# Patient Record
Sex: Male | Born: 1975 | Race: White | Hispanic: No | Marital: Married | Smoking: Never smoker
Health system: Southern US, Community
[De-identification: ages and names within clinical notes are randomized; demographics above are authoritative.]

## PROBLEM LIST (undated history)

## (undated) DIAGNOSIS — E785 Hyperlipidemia, unspecified: Secondary | ICD-10-CM

## (undated) HISTORY — PX: HERNIA REPAIR: SHX51

## (undated) HISTORY — DX: Hyperlipidemia, unspecified: E78.5

---

## 2004-05-06 ENCOUNTER — Emergency Department (HOSPITAL_COMMUNITY): Admission: EM | Admit: 2004-05-06 | Discharge: 2004-05-06 | Payer: Self-pay | Admitting: Emergency Medicine

## 2011-04-16 ENCOUNTER — Ambulatory Visit (INDEPENDENT_AMBULATORY_CARE_PROVIDER_SITE_OTHER): Payer: BC Managed Care – PPO | Admitting: General Surgery

## 2011-04-16 ENCOUNTER — Encounter (INDEPENDENT_AMBULATORY_CARE_PROVIDER_SITE_OTHER): Payer: Self-pay | Admitting: General Surgery

## 2011-04-16 VITALS — BP 132/88 | HR 88 | Temp 98.0°F | Resp 20 | Ht 72.0 in | Wt 212.0 lb

## 2011-04-16 DIAGNOSIS — R1031 Right lower quadrant pain: Secondary | ICD-10-CM

## 2011-04-16 NOTE — Patient Instructions (Signed)
Call me in 6 weeks.  If better with no symptoms, can follow up as needed.  If symptoms persisting or worsening, we will obtain a CT scan of the pelvis and a return visit.

## 2011-04-16 NOTE — Progress Notes (Signed)
Subjective:     Patient ID: Edward Mccullough, male   DOB: [birth year], 35 y.o.   MRN: [id number]  HPI This is a 35 year old male who had a right inguinal hernia repair in 1995 and a left inguinal hernia repair in 1996. Both of these were done with mesh. He has done well until about 2 months ago when he began to notice some right groin discomfort. This is really always when he is sitting down which he does mostly at work. It bothers him mostly every day when he is sitting down. It does not bother him at our way standing or with any activity. He does not notice a bulge or lump in his right groin associated with this at all. It is relieved by standing. He comes in today to see if he has a recurrent right inguinal hernia as the source of this pain.  Review of Systems     Objective:   Physical Exam  Constitutional: He appears well-developed and well-nourished.  Abdominal: Soft. Bowel sounds are normal. He exhibits no mass. There is no tenderness. Hernia confirmed negative in the right inguinal area and confirmed negative in the left inguinal area.  Genitourinary: Testes normal and penis normal. Right testis shows no mass and no tenderness. Left testis shows no mass and no tenderness.  Lymphadenopathy:       Right: No inguinal adenopathy present.       Left: No inguinal adenopathy present.   Well healed bilateral groin incisions    Assessment:     Right groin pain    Plan:     I do not think he has a hernia on his exam today. His history is not really consistent completely with a recurrent hernia. I think most likely he's got strain of his right groin. I would give him 6 weeks of trying to rest this and using anti-inflammatories as needed. If this discomfort persists then we'll proceed with a CT scan of his pelvis in 6 weeks.

## 2011-08-03 ENCOUNTER — Encounter (HOSPITAL_COMMUNITY): Payer: Self-pay | Admitting: *Deleted

## 2011-08-03 ENCOUNTER — Emergency Department (HOSPITAL_COMMUNITY)
Admission: EM | Admit: 2011-08-03 | Discharge: 2011-08-04 | Disposition: A | Discharge status: Home / Self Care | Payer: BC Managed Care – PPO | Attending: Emergency Medicine | Admitting: Emergency Medicine

## 2011-08-03 ENCOUNTER — Emergency Department (HOSPITAL_COMMUNITY): Payer: BC Managed Care – PPO

## 2011-08-03 DIAGNOSIS — R0682 Tachypnea, not elsewhere classified: Secondary | ICD-10-CM | POA: Insufficient documentation

## 2011-08-03 DIAGNOSIS — N201 Calculus of ureter: Secondary | ICD-10-CM | POA: Insufficient documentation

## 2011-08-03 DIAGNOSIS — R109 Unspecified abdominal pain: Secondary | ICD-10-CM | POA: Insufficient documentation

## 2011-08-03 DIAGNOSIS — N23 Unspecified renal colic: Secondary | ICD-10-CM

## 2011-08-03 LAB — URINALYSIS, ROUTINE W REFLEX MICROSCOPIC
Glucose, UA: NEGATIVE mg/dL
Ketones, ur: NEGATIVE mg/dL
Protein, ur: 30 mg/dL — AB
pH: 7.5 (ref 5.0–8.0)

## 2011-08-03 LAB — CBC
MCV: 81.9 fL (ref 78.0–100.0)
Platelets: 185 10*3/uL (ref 150–400)
RBC: 4.8 MIL/uL (ref 4.22–5.81)
RDW: 12.7 % (ref 11.5–15.5)
WBC: 10.8 10*3/uL — ABNORMAL HIGH (ref 4.0–10.5)

## 2011-08-03 LAB — DIFFERENTIAL
Basophils Absolute: 0.1 10*3/uL (ref 0.0–0.1)
Eosinophils Relative: 1 % (ref 0–5)
Lymphocytes Relative: 17 % (ref 12–46)
Lymphs Abs: 1.8 10*3/uL (ref 0.7–4.0)
Neutro Abs: 8.1 10*3/uL — ABNORMAL HIGH (ref 1.7–7.7)

## 2011-08-03 LAB — URINE MICROSCOPIC-ADD ON

## 2011-08-03 MED ORDER — KETOROLAC TROMETHAMINE 30 MG/ML IJ SOLN
30.0000 mg | Freq: Once | INTRAMUSCULAR | Status: AC
  Administered 2011-08-03: 30 mg via INTRAVENOUS
  Filled 2011-08-03: qty 1

## 2011-08-03 MED ORDER — HYDROMORPHONE HCL PF 1 MG/ML IJ SOLN
1.0000 mg | Freq: Once | INTRAMUSCULAR | Status: AC
  Administered 2011-08-03: 1 mg via INTRAVENOUS
  Filled 2011-08-03: qty 1

## 2011-08-03 MED ORDER — SODIUM CHLORIDE 0.9 % IV BOLUS (SEPSIS)
1000.0000 mL | Freq: Once | INTRAVENOUS | Status: AC
  Administered 2011-08-03: 1000 mL via INTRAVENOUS

## 2011-08-03 MED ORDER — ONDANSETRON HCL 4 MG/2ML IJ SOLN
4.0000 mg | Freq: Once | INTRAMUSCULAR | Status: AC
Start: 2011-08-03 — End: 2011-08-03
  Administered 2011-08-03: 4 mg via INTRAVENOUS
  Filled 2011-08-03: qty 2

## 2011-08-03 NOTE — ED Provider Notes (Signed)
History     CSN: [id number]  Arrival date & time 08/03/11  2209   First MD Initiated Contact with Patient 08/03/11 2312      Chief Complaint  Patient presents with  . Flank Pain    (Consider location/radiation/quality/duration/timing/severity/associated sxs/prior treatment) Patient is a 36 y.o. male presenting with flank pain. The history is provided by the patient.  Flank Pain  He had the onset yesterday of severe left flank pain radiating to the left testicle. The pain lasted about an hour and then continued as a mild pain in tell this evening at about 2000 when the pain again became severe. Pain is currently rated at 9/10. There's been associated nausea and vomiting. He has had some urinary hesitancy but no dysuria. He has not had any fever, chills, or sweats. Nothing makes the pain better nothing makes it worse. He is not taken anything for the pain. He denies similar symptoms in the past.  Past Medical History  Diagnosis Date  . Hyperlipidemia     Past Surgical History  Procedure Date  . Hernia repair 95, 96    BIH with mesh    History reviewed. No pertinent family history.  History  Substance Use Topics  . Smoking status: Never Smoker   . Smokeless tobacco: Former Neurosurgeon  . Alcohol Use: Yes      Review of Systems  Genitourinary: Positive for flank pain.  All other systems reviewed and are negative.    Allergies  Review of patient's allergies indicates no known allergies.  Home Medications  No current outpatient prescriptions on file.  BP 169/93  Pulse 79  Temp(Src) 97.8 F (36.6 C) (Oral)  Resp 24  SpO2 100%  Physical Exam  Nursing note and vitals reviewed.  36 year old male who appears to be in pain. Vital signs are significant for mild tachypnea with respiratory rate of 24 and mild to moderate hypertension with blood pressure 169/93. Oxygen saturation is 100% which is normal. Head is normocephalic and atraumatic. PERRLA, EOMI. Oropharynx is clear.  Neck is supple without adenopathy or JVD or tenderness. Back is nontender along the spine but there is moderate to severe left CVA tenderness. Lungs are clear without rales, wheezes, rhonchi. Heart has regular rate and rhythm without murmur. Abdomen is soft, flat, with moderate tenderness in the left lower quadrant without rebound or guarding. Peristalsis is diminished. Extremities have no cyanosis or edema, full range of motion is present. Skin is warm and moist without rash. Neurologic: Mental status is normal, cranial nerves are intact, there no focal motor or sensory deficits.  ED Course  Procedures (including critical care time)  Labs Reviewed  URINALYSIS, ROUTINE W REFLEX MICROSCOPIC - Abnormal; Notable for the following:    APPearance CLOUDY (*)    Hgb urine dipstick LARGE (*)    Protein, ur 30 (*)    All other components within normal limits  URINE MICROSCOPIC-ADD ON   No results found. Results for orders placed during the hospital encounter of 08/03/11  URINALYSIS, ROUTINE W REFLEX MICROSCOPIC      Component Value Range   Color, Urine YELLOW  YELLOW    APPearance CLOUDY (*) CLEAR    Specific Gravity, Urine 1.024  1.005 - 1.030    pH 7.5  5.0 - 8.0    Glucose, UA NEGATIVE  NEGATIVE (mg/dL)   Hgb urine dipstick LARGE (*) NEGATIVE    Bilirubin Urine NEGATIVE  NEGATIVE    Ketones, ur NEGATIVE  NEGATIVE (mg/dL)   Protein,  ur 30 (*) NEGATIVE (mg/dL)   Urobilinogen, UA 0.2  0.0 - 1.0 (mg/dL)   Nitrite NEGATIVE  NEGATIVE    Leukocytes, UA NEGATIVE  NEGATIVE   URINE MICROSCOPIC-ADD ON      Component Value Range   Squamous Epithelial / LPF RARE  RARE    RBC / HPF TOO NUMEROUS TO COUNT  <3 (RBC/hpf)   Urine-Other FIELD OBSCURED BY RBC'S    CBC      Component Value Range   WBC 10.8 (*) 4.0 - 10.5 (K/uL)   RBC 4.80  4.22 - 5.81 (MIL/uL)   Hemoglobin 14.3  13.0 - 17.0 (g/dL)   HCT 47.8  29.5 - 62.1 (%)   MCV 81.9  78.0 - 100.0 (fL)   MCH 29.8  26.0 - 34.0 (pg)   MCHC 36.4 (*)  30.0 - 36.0 (g/dL)   RDW 30.8  65.7 - 84.6 (%)   Platelets 185  150 - 400 (K/uL)  DIFFERENTIAL      Component Value Range   Neutrophils Relative 75  43 - 77 (%)   Neutro Abs 8.1 (*) 1.7 - 7.7 (K/uL)   Lymphocytes Relative 17  12 - 46 (%)   Lymphs Abs 1.8  0.7 - 4.0 (K/uL)   Monocytes Relative 6  3 - 12 (%)   Monocytes Absolute 0.6  0.1 - 1.0 (K/uL)   Eosinophils Relative 1  0 - 5 (%)   Eosinophils Absolute 0.1  0.0 - 0.7 (K/uL)   Basophils Relative 1  0 - 1 (%)   Basophils Absolute 0.1  0.0 - 0.1 (K/uL)  BASIC METABOLIC PANEL      Component Value Range   Sodium 139  135 - 145 (mEq/L)   Potassium 3.5  3.5 - 5.1 (mEq/L)   Chloride 102  96 - 112 (mEq/L)   CO2 27  19 - 32 (mEq/L)   Glucose, Bld 100 (*) 70 - 99 (mg/dL)   BUN 15  6 - 23 (mg/dL)   Creatinine, Ser 9.62  0.50 - 1.35 (mg/dL)   Calcium 8.7  8.4 - 95.2 (mg/dL)   GFR calc non Af Amer 89 (*) >90 (mL/min)   GFR calc Af Amer >90  >90 (mL/min)   Ct Abdomen Pelvis Wo Contrast  08/04/2011  *RADIOLOGY REPORT*  Clinical Data: Severe left flank pain, vomiting, chills  CT ABDOMEN AND PELVIS WITHOUT CONTRAST  Technique:  Multidetector CT imaging of the abdomen and pelvis was performed following the standard protocol without intravenous contrast. Sagittal and coronal MPR images reconstructed from axial data set.  Comparison: None  Findings: Minimal dependent atelectasis at lung bases. Left hydronephrosis and proximal left hydroureter secondary to a 5 mm diameter 6 mm length mid left ureteral calculus image 72. Tiny nonobstructing right renal calculi. Distal ureters and bladder decompressed. Within limitations of a nonenhanced exam, no focal abnormalities of the liver, spleen, pancreas, or adrenal glands identified. Normal appendix. Tiny umbilical hernia containing fat. No mass, adenopathy, free fluid, or inflammatory process. Stomach and bowel loops unremarkable for technique. No acute osseous findings.  IMPRESSION: Left hydronephrosis and  proximal hydroureter secondary to a 5 mm diameter mid left ureteral calculus. Tiny nonobstructing right renal calculi.  Original Report Authenticated By: Lollie Marrow, M.D.      1. Ureterolithiasis   2. Renal colic on left side    00 45: Patient has had excellent relief of pain with IV Dilaudid, IV Zofran, and IV Toradol. CT scan confirms a left ureteral calculus.  He will be sent home with prescriptions for Percocet, Naprosyn, Reglan, and Flomax. He is referred to urology for followup.   MDM  Clinically, he appears to have an episode of renal colic. You'll be given IV fluids, IV antiemetics, IV narcotics, and IV NSAIDs. CT of the abdomen and pelvis has been ordered.        Dione Booze, MD 08/04/11 [phone]

## 2011-08-03 NOTE — ED Notes (Signed)
Started IV and bolus of NS.  Pt continues to have pain.  Pain is 9/10 in flank

## 2011-08-03 NOTE — ED Notes (Signed)
Pt notified that urine sample is needed.  Showed to bathroom and urine cup given.  Pt repositioned for comfort.  Family is at the bedside.

## 2011-08-03 NOTE — ED Notes (Signed)
Starting yesterday around 2000, the pt began to have severe left flank pain that lasted for only one hour.  Tonight @ approximately 2000, there pain returned and is located still in his left flank area and rated @ 7/10.  Pt has experienced vomiting and chills.  Pt denies hematuria or hx of same.

## 2011-08-04 LAB — BASIC METABOLIC PANEL
CO2: 27 mEq/L (ref 19–32)
Calcium: 8.7 mg/dL (ref 8.4–10.5)
Chloride: 102 mEq/L (ref 96–112)
Glucose, Bld: 100 mg/dL — ABNORMAL HIGH (ref 70–99)
Potassium: 3.5 mEq/L (ref 3.5–5.1)
Sodium: 139 mEq/L (ref 135–145)

## 2011-08-04 MED ORDER — TAMSULOSIN HCL 0.4 MG PO CAPS: 0.4000 mg | ORAL_CAPSULE | Freq: Every day | ORAL | Status: DC

## 2011-08-04 MED ORDER — NAPROXEN 500 MG PO TABS: 500.0000 mg | ORAL_TABLET | Freq: Two times a day (BID) | ORAL | Status: DC

## 2011-08-04 MED ORDER — METOCLOPRAMIDE HCL 10 MG PO TABS: 10.0000 mg | ORAL_TABLET | Freq: Four times a day (QID) | ORAL | Status: DC | PRN

## 2011-08-04 MED ORDER — METOCLOPRAMIDE HCL 10 MG PO TABS: 10.0000 mg | ORAL_TABLET | Freq: Four times a day (QID) | ORAL | Status: AC | PRN

## 2011-08-04 MED ORDER — OXYCODONE-ACETAMINOPHEN 5-325 MG PO TABS: 1.0000 | ORAL_TABLET | ORAL | Status: AC | PRN

## 2011-08-22 ENCOUNTER — Other Ambulatory Visit: Payer: Self-pay | Admitting: Urology

## 2011-08-28 ENCOUNTER — Encounter (HOSPITAL_COMMUNITY): Payer: Self-pay | Admitting: Pharmacy Technician

## 2011-08-30 ENCOUNTER — Encounter (HOSPITAL_COMMUNITY): Payer: Self-pay | Admitting: *Deleted

## 2011-09-05 ENCOUNTER — Ambulatory Visit (HOSPITAL_COMMUNITY)
Admission: RE | Admit: 2011-09-05 | Discharge: 2011-09-05 | Disposition: A | Discharge status: Home / Self Care | Payer: BC Managed Care – PPO | Source: Ambulatory Visit | Attending: Urology | Admitting: Urology

## 2011-09-05 ENCOUNTER — Encounter (HOSPITAL_COMMUNITY): Payer: Self-pay | Admitting: *Deleted

## 2011-09-05 ENCOUNTER — Encounter (HOSPITAL_COMMUNITY)
Admission: RE | Disposition: A | Discharge status: Home / Self Care | Payer: Self-pay | Source: Ambulatory Visit | Attending: Urology

## 2011-09-05 ENCOUNTER — Ambulatory Visit (HOSPITAL_COMMUNITY): Payer: BC Managed Care – PPO

## 2011-09-05 DIAGNOSIS — E7800 Pure hypercholesterolemia, unspecified: Secondary | ICD-10-CM | POA: Insufficient documentation

## 2011-09-05 DIAGNOSIS — Z8711 Personal history of peptic ulcer disease: Secondary | ICD-10-CM | POA: Insufficient documentation

## 2011-09-05 DIAGNOSIS — N201 Calculus of ureter: Secondary | ICD-10-CM | POA: Insufficient documentation

## 2011-09-05 SURGERY — LITHOTRIPSY, ESWL
Anesthesia: LOCAL | Laterality: Left

## 2011-09-05 MED ORDER — OXYCODONE-ACETAMINOPHEN 5-325 MG PO TABS: 1.0000 | ORAL_TABLET | ORAL | Status: AC | PRN

## 2011-09-05 MED ORDER — DIPHENHYDRAMINE HCL 25 MG PO CAPS
25.0000 mg | ORAL_CAPSULE | ORAL | Status: AC
  Administered 2011-09-05: 25 mg via ORAL

## 2011-09-05 MED ORDER — DIAZEPAM 5 MG PO TABS
10.0000 mg | ORAL_TABLET | ORAL | Status: AC
  Administered 2011-09-05: 10 mg via ORAL

## 2011-09-05 MED ORDER — DEXTROSE-NACL 5-0.45 % IV SOLN
INTRAVENOUS | Status: DC
  Administered 2011-09-05: 1000 mL via INTRAVENOUS

## 2011-09-05 MED ORDER — CIPROFLOXACIN HCL 500 MG PO TABS
500.0000 mg | ORAL_TABLET | ORAL | Status: AC
  Administered 2011-09-05: 500 mg via ORAL

## 2011-09-05 NOTE — Brief Op Note (Signed)
09/05/2011  7:45 AM  PATIENT:  Edward Mccullough  36 y.o. male  PRE-OPERATIVE DIAGNOSIS:  Left distal Ureter Stone  POST-OPERATIVE DIAGNOSIS: Left distal ureter stone  PROCEDURE:  Procedure(s) (LRB): EXTRACORPOREAL SHOCK WAVE LITHOTRIPSY (ESWL) (Left)  SURGEON:  Surgeon(s) and Role:    * Milford Cage, MD - Primary  PHYSICIAN ASSISTANT:   ASSISTANTS: none   ANESTHESIA:   IV sedation  EBL:   None  BLOOD ADMINISTERED:none  DRAINS: none   LOCAL MEDICATIONS USED:  NONE  SPECIMEN:  No Specimen  DISPOSITION OF SPECIMEN:  N/A  COUNTS:  YES  TOURNIQUET:  * No tourniquets in log *  DICTATION: .Note written in paper chart  PLAN OF CARE: Discharge to home after PACU  PATIENT DISPOSITION:  PACU - hemodynamically stable.   Delay start of Pharmacological VTE agent (>24hrs) due to surgical blood loss or risk of bleeding: yes

## 2011-09-05 NOTE — H&P (Signed)
Chief Complaint  Left ureter stone   History of Present Illness    36 year old male with  a left distal ureteral stone. This stone is 6 mm and visible on KUB from 08/08/11. He presents today for shockwave lithotripsy of this stone.  We have discussed the risks, benefits, alternatives, and likelihood of achieving his goals.  Past Medical History Problems  1. History of  Gastric Ulcer 531.90 2. History of  Hypercholesterolemia 272.0  Surgical History Problems  1. History of  Dental Surgery 2. History of  Inguinal Hernia Repair Left 3. History of  Inguinal Hernia Repair Right  Current Meds 1. Hydrocodone-Acetaminophen 7.5-325 MG Oral Tablet; TAKE 1 TO 2 TABLETS EVERY 4 TO 6  HOURS AS NEEDED FOR PAIN; Therapy: 17Jan2013 to (Evaluate:21Jan2013); Last  Rx:17Jan2013 2. Naproxen 500 MG Oral Tablet; Therapy: (Recorded:17Jan2013) to 3. Oxycodone-Acetaminophen 5-325 MG Oral Tablet; Therapy: (Recorded:17Jan2013) to 4. Reglan 10 MG Oral Tablet; Therapy: (Recorded:17Jan2013) to 5. Tamsulosin HCl 0.4 MG Oral Capsule; Therapy: (Recorded:17Jan2013) to  Allergies Medication  1. No Known Drug Allergies  Family History Problems  1. Family history of  Family Health Status Number Of Children 2 sons  1 daughter 2. Paternal history of  Hypertension V17.49 3. Paternal history of  Nephrolithiasis  Social History Problems    Alcohol Use rarely   Caffeine Use 1  24 oz a day   Marital History - Currently Married   Never A Smoker   Occupation: Dispensing optician    History of  Tobacco Use 305.1  Review of Systems Constitutional, skin, eye, otolaryngeal, hematologic/lymphatic, cardiovascular, pulmonary, endocrine, musculoskeletal, gastrointestinal, neurological and psychiatric system(s) were reviewed and pertinent findings if present are noted.  Respiratory: cough.  Musculoskeletal: back pain.     Physical Exam Constitutional: Well nourished and well developed . No acute distress.  ENT:.  The ears and nose are normal in appearance. The oropharynx is normal.  Neck: The appearance of the neck is normal and no neck mass is present.  Pulmonary: No respiratory distress and normal respiratory rhythm and effort.  Cardiovascular: Heart rate and rhythm are normal . No peripheral edema.  Abdomen: The abdomen is soft and nontender. The abdomen is no rebound. No CVA tenderness.  Rectal: Rectal exam demonstrates no tenderness and no masses. The prostate has no nodularity and is not tender. The left seminal vesicle is nonpalpable. The right seminal vesicle is nonpalpable. The perineum is normal on inspection.  Lymphatics: The posterior cervical and supraclavicular nodes are not enlarged or tender.  Skin: Normal skin turgor and no visible rash.  Neuro/Psych:. Mood and affect are appropriate.     Assessment Left Ureteral Stone   Plan Proceed with  left shock wave lithotripsy of his distal ureter stone.

## 2011-09-05 NOTE — Progress Notes (Signed)
Pt drinking and eating crackers without difficulty.  Pt has no complaints.

## 2011-09-05 NOTE — Progress Notes (Signed)
Pt up in room ambulating in hall to bathroom.  Pt voided and bloody urine noted with stone particles noted as well.  Discharge instructions given to pt's Aunt Vera.  Pt and pt's family states pt's wife is at home and will be with husband.

## 2011-09-05 NOTE — Progress Notes (Signed)
Left anterior abd/side is red, no bleeding noted.

## 2011-09-05 NOTE — Discharge Instructions (Signed)
DISCHARGE INSTRUCTIONS FOR KIDNEY STONES   MEDICATIONS:   1. DO NOT RESUME YOUR ASPIRIN, or any other medicines like ibuprofen, motrin, excedrin, advil, aleve, vitamin E, fish oil as these can all cause bleeding x 7 days.  2. Resume all your other meds from home.  ACTIVITY 1. No strenuous activity x 1week 2. No driving while on narcotic pain medications 3. Drink plenty of water 4. Continue to walk at home - you can still get blood clots when you are at home, so keep active, but don't over do it. 5. May return to work in 3 days.  BATHING 1. You can shower and take a bath.  SIGNS/SYMPTOMS TO CALL: 1. Please call us if you have a fever greater than 101.5, uncontrolled  nausea/vomiting, uncontrolled pain, dizziness, unable to urinate, chest pain, shortness of breath, leg swelling, leg pain, redness around wound, drainage from wound, or any other concerns or questions.  You can reach [place] at [phone].

## 2012-12-04 IMAGING — CT CT ABD-PELV W/O CM
2 of 4 series · 16 of 46 positions shown, 18 images · non-contrast
Comparison: None

CLINICAL DATA: Severe left flank pain, vomiting, chills

CT ABDOMEN AND PELVIS WITHOUT CONTRAST
TECHNIQUE: Multidetector CT imaging of the abdomen and pelvis was
performed following the standard protocol without intravenous
contrast. Sagittal and coronal MPR images reconstructed from axial
data set.

[Series 2: abd/pel w/o · axial · non-contrast · 0.74mm/px · z∈[-539,-49]mm · 13 of 108 slices shown, 15 images]
[im 5/108  soft-tissue]
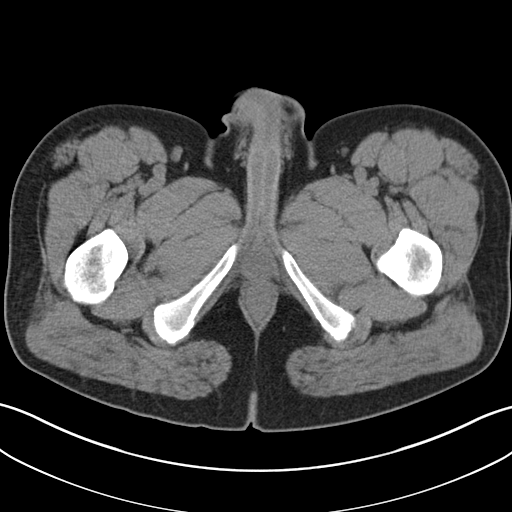
[im 5/108  bone]
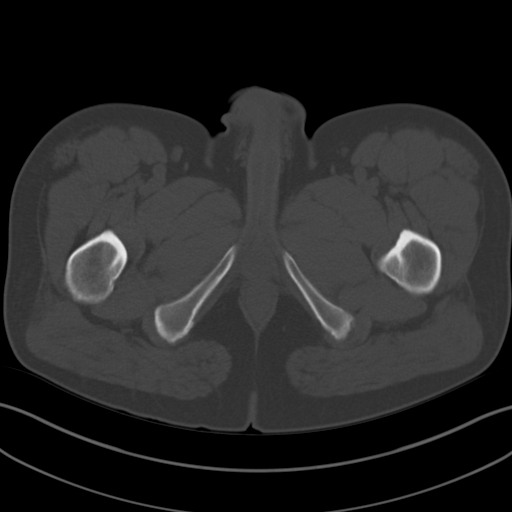
[im 14/108  soft-tissue]
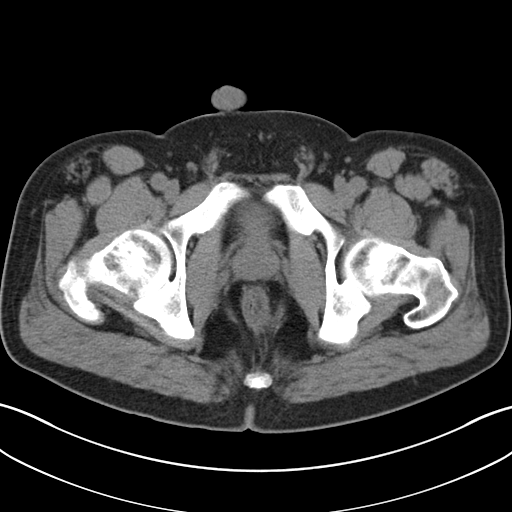
[im 23/108  soft-tissue]
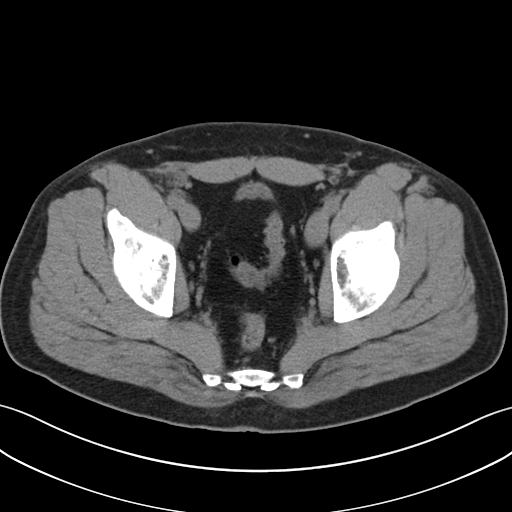
[im 32/108  soft-tissue]
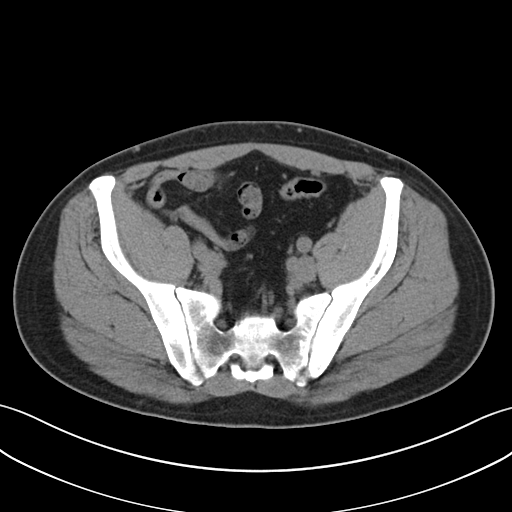
[im 36/108  soft-tissue]
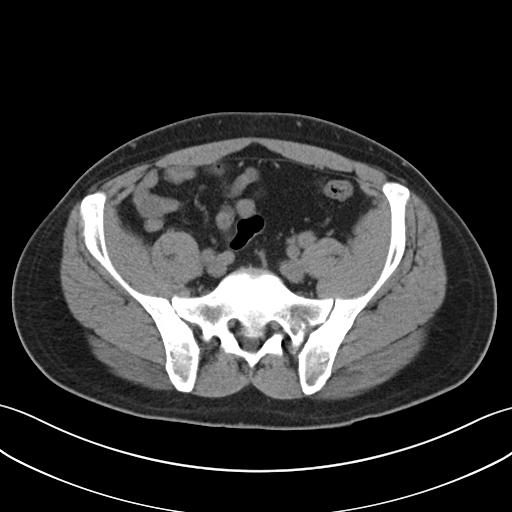
[im 45/108  soft-tissue]
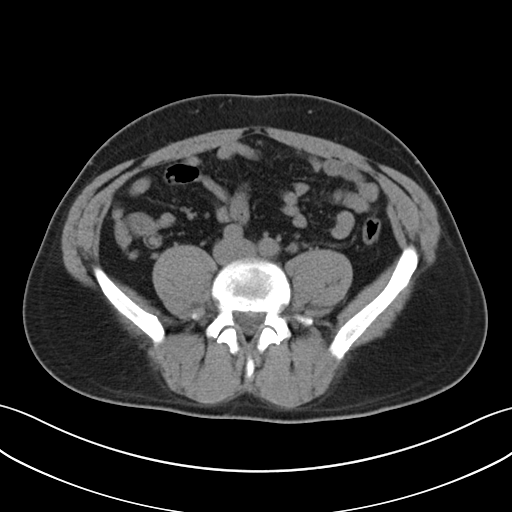
[im 54/108  soft-tissue]
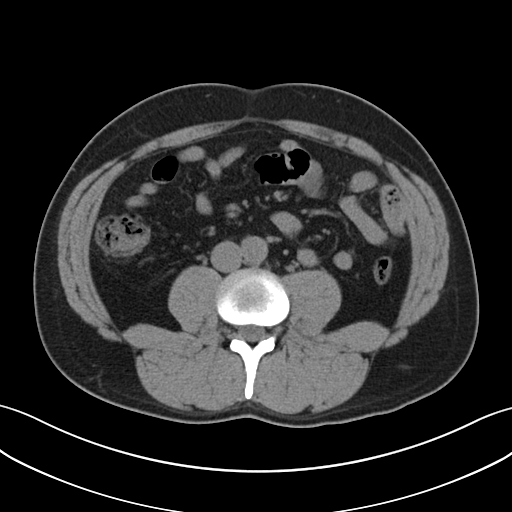
[im 63/108  soft-tissue]
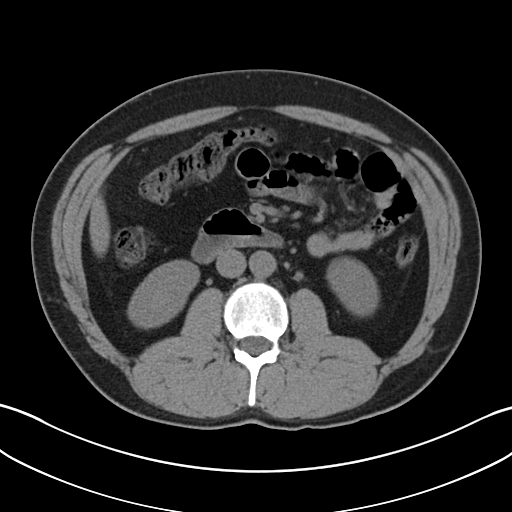
[im 72/108  soft-tissue]
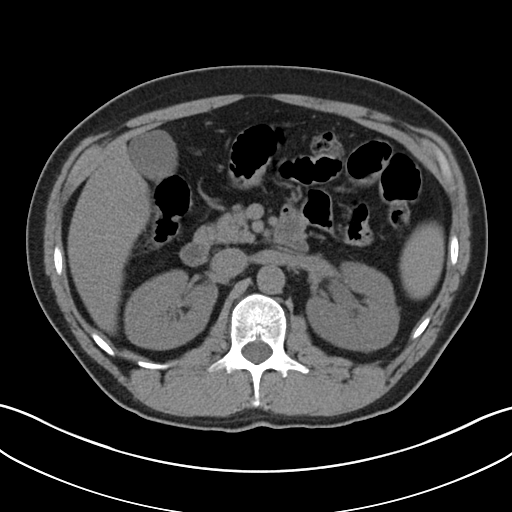
[im 72/108  bone]
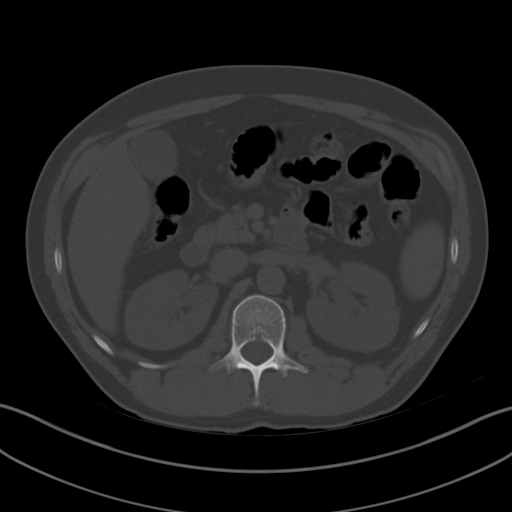
[im 76/108  soft-tissue]
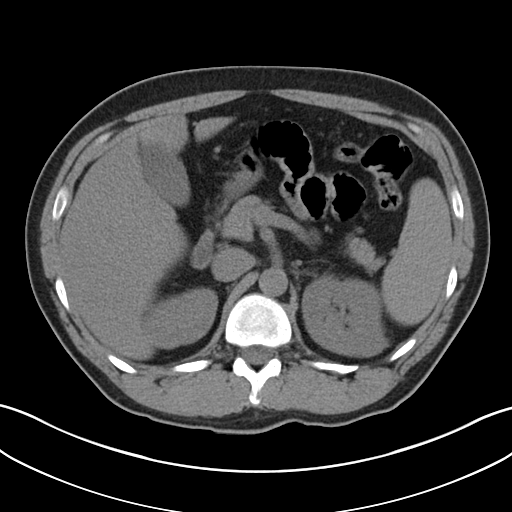
[im 85/108  soft-tissue]
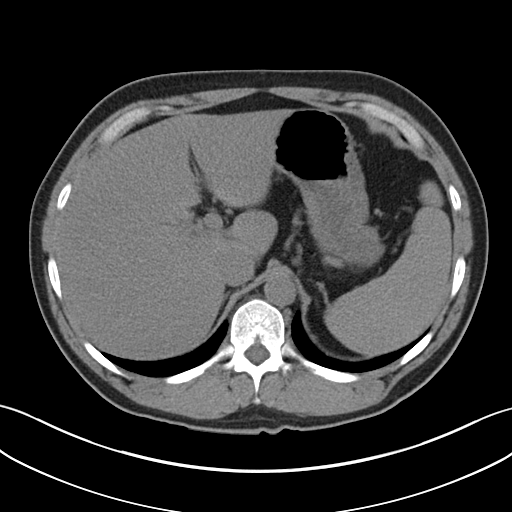
[im 94/108  soft-tissue]
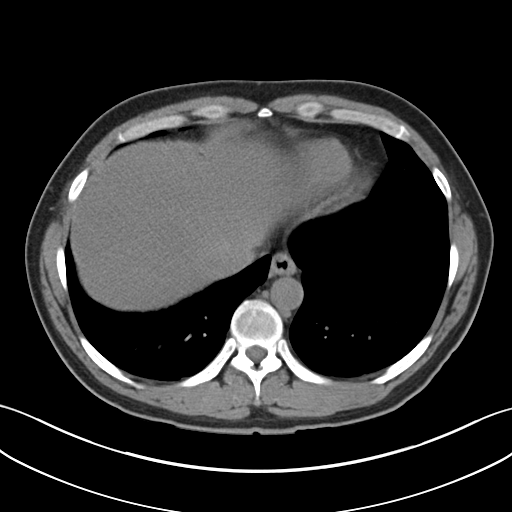
[im 103/108  soft-tissue]
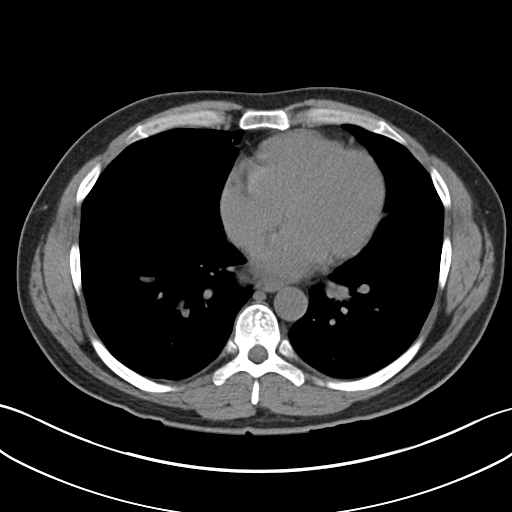

[Series 5: coronal · coronal · 0.74mm/px · 3 of 50 slices shown]
[im 17/50  soft-tissue]
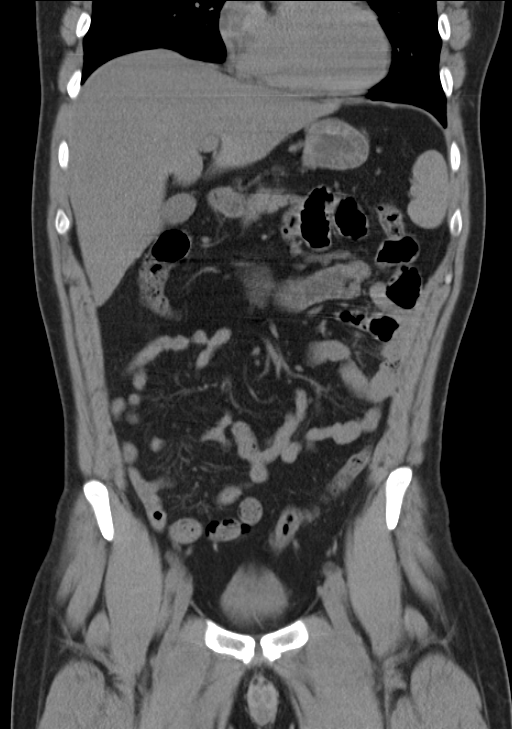
[im 22/50  soft-tissue]
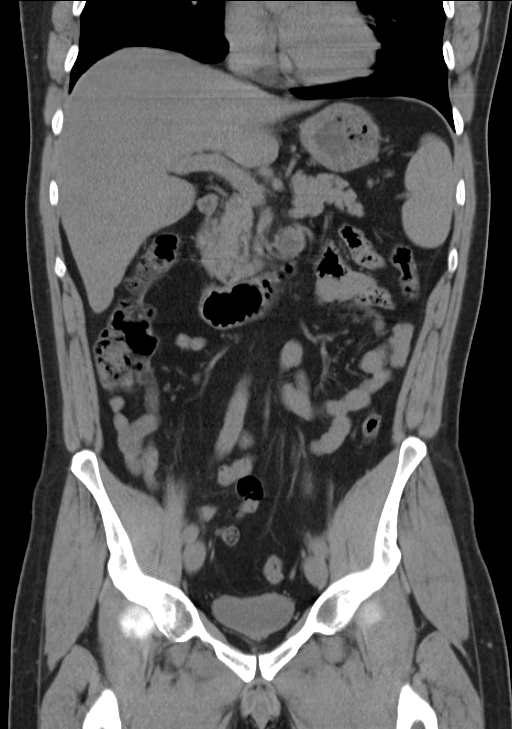
[im 28/50  soft-tissue]
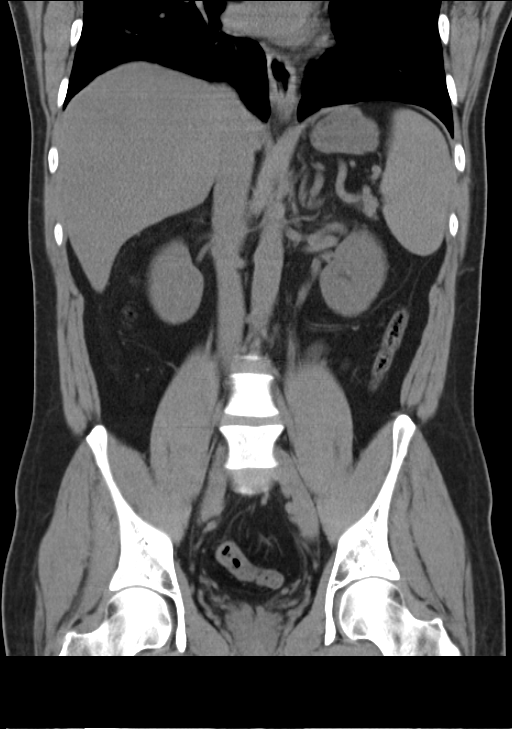

[16 of 46 positions shown; findings below may reference images not displayed]

FINDINGS: Minimal dependent atelectasis at lung bases.
Left hydronephrosis and proximal left hydroureter secondary to a 5
mm diameter 6 mm length mid left ureteral calculus image 72.
Tiny nonobstructing right renal calculi.
Distal ureters and bladder decompressed.
Within limitations of a nonenhanced exam, no focal abnormalities of
the liver, spleen, pancreas, or adrenal glands identified.
Normal appendix.
Tiny umbilical hernia containing fat.
No mass, adenopathy, free fluid, or inflammatory process.
Stomach and bowel loops unremarkable for technique.
No acute osseous findings.
IMPRESSION: Left hydronephrosis and proximal hydroureter secondary to a 5 mm
diameter mid left ureteral calculus.
Tiny nonobstructing right renal calculi.

## 2014-03-21 ENCOUNTER — Ambulatory Visit: Payer: BC Managed Care – PPO | Attending: Physician Assistant

## 2014-03-21 DIAGNOSIS — M25519 Pain in unspecified shoulder: Secondary | ICD-10-CM | POA: Diagnosis not present

## 2014-03-21 DIAGNOSIS — IMO0001 Reserved for inherently not codable concepts without codable children: Secondary | ICD-10-CM | POA: Insufficient documentation

## 2014-03-21 DIAGNOSIS — M25619 Stiffness of unspecified shoulder, not elsewhere classified: Secondary | ICD-10-CM | POA: Diagnosis not present

## 2014-04-04 ENCOUNTER — Encounter: Payer: BC Managed Care – PPO | Admitting: Physical Therapy

## 2016-09-18 ENCOUNTER — Emergency Department (HOSPITAL_COMMUNITY): Payer: BLUE CROSS/BLUE SHIELD

## 2016-09-18 ENCOUNTER — Emergency Department (HOSPITAL_COMMUNITY)
Admission: EM | Admit: 2016-09-18 | Discharge: 2016-09-18 | Disposition: A | Discharge status: Home / Self Care | Payer: BLUE CROSS/BLUE SHIELD | Attending: Emergency Medicine | Admitting: Emergency Medicine

## 2016-09-18 ENCOUNTER — Encounter (HOSPITAL_COMMUNITY): Payer: Self-pay | Admitting: Emergency Medicine

## 2016-09-18 DIAGNOSIS — R072 Precordial pain: Secondary | ICD-10-CM | POA: Insufficient documentation

## 2016-09-18 DIAGNOSIS — R079 Chest pain, unspecified: Secondary | ICD-10-CM

## 2016-09-18 LAB — CBC
HCT: 41.7 % (ref 39.0–52.0)
HEMOGLOBIN: 14.9 g/dL (ref 13.0–17.0)
MCH: 28.5 pg (ref 26.0–34.0)
MCHC: 35.7 g/dL (ref 30.0–36.0)
MCV: 79.7 fL (ref 78.0–100.0)
PLATELETS: 193 10*3/uL (ref 150–400)
RBC: 5.23 MIL/uL (ref 4.22–5.81)
RDW: 13.2 % (ref 11.5–15.5)
WBC: 6.1 10*3/uL (ref 4.0–10.5)

## 2016-09-18 LAB — BASIC METABOLIC PANEL
ANION GAP: 7 (ref 5–15)
BUN: 16 mg/dL (ref 6–20)
CHLORIDE: 107 mmol/L (ref 101–111)
CO2: 25 mmol/L (ref 22–32)
CREATININE: 0.93 mg/dL (ref 0.61–1.24)
Calcium: 9.2 mg/dL (ref 8.9–10.3)
GFR calc Af Amer: >60 mL/min (ref >60)
GFR calc non Af Amer: >60 mL/min (ref >60)
Glucose, Bld: 104 mg/dL — ABNORMAL HIGH (ref 65–99)
POTASSIUM: 4.1 mmol/L (ref 3.5–5.1)
SODIUM: 139 mmol/L (ref 135–145)

## 2016-09-18 LAB — I-STAT TROPONIN, ED
TROPONIN I, POC: 0 ng/mL (ref 0.00–0.08)
Troponin i, poc: 0 ng/mL (ref 0.00–0.08)

## 2016-09-18 MED ORDER — ASPIRIN 81 MG PO CHEW
324.0000 mg | CHEWABLE_TABLET | Freq: Once | ORAL | Status: AC
  Administered 2016-09-18: 324 mg via ORAL
  Filled 2016-09-18: qty 4

## 2016-09-18 MED ORDER — IBUPROFEN 800 MG PO TABS: 800.0000 mg | ORAL_TABLET | Freq: Four times a day (QID) | ORAL | 0 refills | Status: AC | PRN

## 2016-09-18 NOTE — ED Notes (Signed)
Pt woke up at 5:30am  ( center of chest to was hurting pain 4 out 10) . Pt was concerned and came to ed with his vehicle/.  Hx of elevated triglycerides.  Former smokeless tobacco use for 10 years, quit 3 yrs ago. Reports no SOB, LOC, dizziness, vision changes, etc.

## 2016-09-18 NOTE — ED Triage Notes (Signed)
Pt presents to ED with c/o mid chest pain, describes as dull and pulsating, onset this morning. Pt states does not remember if chest pain woke him up or not. Hx of acid reflux, but states pain feels different from acid reflux.

## 2016-09-18 NOTE — ED Provider Notes (Signed)
[facility] DEPT Provider Note   CSN: [id number] Arrival date & time: 09/18/16  0636     History   Chief Complaint Chief Complaint  Patient presents with  . Chest Pain    HPI Edward Mccullough is a 41 y.o. male.  The history is provided by the patient.  Chest Pain   This is a new problem. The current episode started 1 to 2 hours ago. The problem occurs constantly. The problem has not changed since onset.Associated with: unknown. The pain is present in the substernal region. The pain is mild. The quality of the pain is described as dull (aching). The pain does not radiate. Duration of episode(s) is 2 hours. Pertinent negatives include no abdominal pain, no back pain, no cough, no diaphoresis, no fever, no nausea, no shortness of breath and no vomiting. He has tried nothing for the symptoms. Risk factors include male gender.  His past medical history is significant for hyperlipidemia.  Pertinent negatives for past medical history include no diabetes and no hypertension.  Pertinent negatives for family medical history include: no CAD.    Past Medical History:  Diagnosis Date  . Hyperlipidemia     Patient Active Problem List   Diagnosis Date Noted  . Right groin pain 04/16/2011    Past Surgical History:  Procedure Laterality Date  . HERNIA REPAIR  95, 62   BIH with mesh       Home Medications    Prior to Admission medications   Not on File    Family History History reviewed. No pertinent family history.  Social History Social History  Substance Use Topics  . Smoking status: Never Smoker  . Smokeless tobacco: Current User    Types: Chew  . Alcohol use Yes     Allergies   Patient has no known allergies.   Review of Systems Review of Systems  Constitutional: Negative for diaphoresis and fever.  Respiratory: Negative for cough and shortness of breath.   Cardiovascular: Positive for chest pain.  Gastrointestinal: Negative for abdominal pain, nausea and  vomiting.  Musculoskeletal: Negative for back pain.  All other systems reviewed and are negative.    Physical Exam Updated Vital Signs BP 149/97 (BP Location: Right Arm)   Pulse 84   Resp 22   Ht 6' (1.829 m)   Wt 227 lb (103 kg)   SpO2 97%   BMI 30.79 kg/m   Physical Exam  Constitutional: He is oriented to person, place, and time. He appears well-developed and well-nourished. No distress.  HENT:  Head: Normocephalic and atraumatic.  Nose: Nose normal.  Eyes: Conjunctivae are normal.  Neck: Neck supple. No tracheal deviation present.  Cardiovascular: Normal rate, regular rhythm and normal heart sounds.   Pulmonary/Chest: Effort normal and breath sounds normal. No respiratory distress. He exhibits no tenderness.  Abdominal: Soft. He exhibits no distension. There is no tenderness. There is no guarding.  Neurological: He is alert and oriented to person, place, and time.  Skin: Skin is warm and dry.  Psychiatric: He has a normal mood and affect.  Vitals reviewed.    ED Treatments / Results  Labs (all labs ordered are listed, but only abnormal results are displayed) Labs Reviewed  BASIC METABOLIC PANEL - Abnormal; Notable for the following:       Result Value   Glucose, Bld 104 (*)    All other components within normal limits  CBC  I-STAT TROPOININ, ED  I-STAT TROPOININ, ED    EKG  EKG Interpretation  Date/Time:  Wednesday September 18 2016 06:45:41 EST Ventricular Rate:  83 PR Interval:    QRS Duration: 103 QT Interval:  346 QTC Calculation: 407 R Axis:   83 Text Interpretation:  Sinus rhythm Benign early repolarization No previous tracing Confirmed by Dania Marsan MD, Quillian Quince ([id number]) on 09/18/2016 7:05:20 AM Also confirmed by Aroura Vasudevan MD, Maxxon Schwanke [phone]), editor [address], Leda Gauze [phone])  on 09/18/2016 7:37:48 AM       Radiology Dg Chest 2 View  Result Date: 09/18/2016 CLINICAL DATA:  Acute onset of mid chest pain.  Initial encounter. EXAM: CHEST  2 VIEW COMPARISON:   None. FINDINGS: The lungs are well-aerated and clear. There is no evidence of focal opacification, pleural effusion or pneumothorax. The heart is normal in size; the mediastinal contour is within normal limits. No acute osseous abnormalities are seen. IMPRESSION: No acute cardiopulmonary process seen. Electronically Signed   By: Garald Balding M.D.   On: 09/18/2016 07:04    Procedures Procedures (including critical care time)  Medications Ordered in ED Medications  aspirin chewable tablet 324 mg (324 mg Oral Given 09/18/16 0820)     Initial Impression / Assessment and Plan / ED Course  I have reviewed the triage vital signs and the nursing notes.  Pertinent labs & imaging results that were available during my care of the patient were reviewed by me and considered in my medical decision making (see chart for details).     41 year old male presents with chest pain starting this morning at 5 AM that he noticed upon waking. He describes it as a dull ache. No radiation, no diaphoresis, no nausea or vomiting. Pain appears very atypical for ACS. No strong family history. He has a single risk factor of hyperlipidemia. EKG with early repolarization and no prior for comparison. First troponin is negative. The patient is PERC negative. Provided aspirin for cardiac prophylaxis. Discussed the need for a serial troponin for ACS rule out given his recent onset of symptoms.  Second troponin negative, suspect MSK etiology. Patient was recommended to take short course of scheduled NSAIDs and engage in early mobility as definitive treatment.   Final Clinical Impressions(s) / ED Diagnoses   Final diagnoses:  Nonspecific chest pain    New Prescriptions New Prescriptions   IBUPROFEN (ADVIL,MOTRIN) 800 MG TABLET    Take 1 tablet (800 mg total) by mouth every 6 (six) hours as needed for moderate pain.     Leo Grosser, MD 09/18/16 1027

## 2019-07-29 ENCOUNTER — Other Ambulatory Visit: Payer: Self-pay | Admitting: Otolaryngology

## 2019-07-29 DIAGNOSIS — R1314 Dysphagia, pharyngoesophageal phase: Secondary | ICD-10-CM

## 2019-07-29 DIAGNOSIS — K219 Gastro-esophageal reflux disease without esophagitis: Secondary | ICD-10-CM

## 2020-11-02 LAB — COLOGUARD: COLOGUARD: NEGATIVE

## 2022-07-30 ENCOUNTER — Ambulatory Visit: Payer: BLUE CROSS/BLUE SHIELD | Admitting: Physician Assistant
